# Patient Record
Sex: Female | Born: 1995 | Race: Black or African American | Hispanic: No | Marital: Single | Smoking: Never smoker
Health system: Southern US, Community
[De-identification: ages and names within clinical notes are randomized; demographics above are authoritative.]

## PROBLEM LIST (undated history)

## (undated) DIAGNOSIS — K219 Gastro-esophageal reflux disease without esophagitis: Secondary | ICD-10-CM

## (undated) DIAGNOSIS — K220 Achalasia of cardia: Secondary | ICD-10-CM

## (undated) DIAGNOSIS — Z973 Presence of spectacles and contact lenses: Secondary | ICD-10-CM

## (undated) DIAGNOSIS — R079 Chest pain, unspecified: Secondary | ICD-10-CM

## (undated) HISTORY — PX: NO PAST SURGERIES: SHX2092

---

## 2016-02-04 ENCOUNTER — Other Ambulatory Visit: Payer: Self-pay | Admitting: Gastroenterology

## 2016-02-04 DIAGNOSIS — R1310 Dysphagia, unspecified: Secondary | ICD-10-CM

## 2016-02-13 ENCOUNTER — Encounter (HOSPITAL_COMMUNITY): Payer: Self-pay | Admitting: *Deleted

## 2016-02-13 ENCOUNTER — Emergency Department (HOSPITAL_COMMUNITY)
Admission: EM | Admit: 2016-02-13 | Discharge: 2016-02-13 | Disposition: A | Discharge status: Home / Self Care | Payer: BLUE CROSS/BLUE SHIELD | Attending: Emergency Medicine | Admitting: Emergency Medicine

## 2016-02-13 DIAGNOSIS — R1310 Dysphagia, unspecified: Secondary | ICD-10-CM | POA: Diagnosis not present

## 2016-02-13 DIAGNOSIS — Z79899 Other long term (current) drug therapy: Secondary | ICD-10-CM | POA: Insufficient documentation

## 2016-02-13 DIAGNOSIS — R112 Nausea with vomiting, unspecified: Secondary | ICD-10-CM | POA: Diagnosis not present

## 2016-02-13 LAB — BASIC METABOLIC PANEL
Anion gap: 8 (ref 5–15)
BUN: 8 mg/dL (ref 6–20)
CALCIUM: 9.8 mg/dL (ref 8.9–10.3)
CO2: 29 mmol/L (ref 22–32)
CREATININE: 0.84 mg/dL (ref 0.44–1.00)
Chloride: 105 mmol/L (ref 101–111)
GFR calc non Af Amer: >60 mL/min (ref >60)
Glucose, Bld: 82 mg/dL (ref 65–99)
Potassium: 3.6 mmol/L (ref 3.5–5.1)
SODIUM: 142 mmol/L (ref 135–145)

## 2016-02-13 LAB — CBC WITH DIFFERENTIAL/PLATELET
BASOS PCT: 1 %
Basophils Absolute: 0 10*3/uL (ref 0.0–0.1)
EOS ABS: 0.1 10*3/uL (ref 0.0–0.7)
EOS PCT: 3 %
HCT: 42.8 % (ref 36.0–46.0)
Hemoglobin: 14 g/dL (ref 12.0–15.0)
Lymphocytes Relative: 46 %
Lymphs Abs: 2.6 10*3/uL (ref 0.7–4.0)
MCH: 26.6 pg (ref 26.0–34.0)
MCHC: 32.7 g/dL (ref 30.0–36.0)
MCV: 81.2 fL (ref 78.0–100.0)
MONO ABS: 0.3 10*3/uL (ref 0.1–1.0)
MONOS PCT: 6 %
NEUTROS ABS: 2.4 10*3/uL (ref 1.7–7.7)
NEUTROS PCT: 44 %
Platelets: 262 10*3/uL (ref 150–400)
RBC: 5.27 MIL/uL — ABNORMAL HIGH (ref 3.87–5.11)
RDW: 13.6 % (ref 11.5–15.5)
WBC: 5.5 10*3/uL (ref 4.0–10.5)

## 2016-02-13 LAB — I-STAT BETA HCG BLOOD, ED (MC, WL, AP ONLY)

## 2016-02-13 MED ORDER — SODIUM CHLORIDE 0.9 % IV BOLUS (SEPSIS)
1000.0000 mL | Freq: Once | INTRAVENOUS | Status: AC
  Administered 2016-02-13: 1000 mL via INTRAVENOUS

## 2016-02-13 MED ORDER — ONDANSETRON HCL 4 MG PO TABS: 4.0000 mg | ORAL_TABLET | Freq: Four times a day (QID) | ORAL | 0 refills | Status: DC

## 2016-02-13 MED ORDER — ONDANSETRON HCL 4 MG/2ML IJ SOLN: 4.0000 mg | Freq: Once | INTRAMUSCULAR | Status: DC

## 2016-02-13 NOTE — Progress Notes (Signed)
Patient listed as not having insurance or a pcp.  EDCM spoke to patient at bedside.  Patient reports she has [employer] insurance.  She reports she doesn't really have a pcp.  She saw a doctor today who referred her to the ED and reports they are trying get patient established with Dr. Susa [facility]Via of [facility].  No further EDCM needs at this time.

## 2016-02-13 NOTE — ED Triage Notes (Signed)
Pt reports the dysphagia has been doing on for 3 years, states that for the past few months it has  Gotten worse where she cannot have anything to drink without vomiting.  Went to see Dr. Loreta AveMann and had an ENDO done a few weeks ago and found to have Achalasia and is scheduled to have a barium swallow tomorrow.  Today, she went to the doctor's office d/t not being able keep anything down for the past 2 days, was sent here for IVF and admission to have the barium swallow.

## 2016-02-13 NOTE — ED Provider Notes (Signed)
[facility]-EMERGENCY DEPT Provider Note   CSN: [id number] Arrival date & time: 02/13/16  1512     History   Chief Complaint Chief Complaint  Patient presents with  . Dysphagia    HPI [name] is a 20 y.o. female.  HPI Pt has been having difficulty with swallowing for the last several years.  In the last few months it has gotten worse.  She would have difficulty swallowing  every day.  Then in the past two weeks since has been vomiting daily, sometimes after dinner, sometimes at night.  She was having trouble keeping down liquids and solids.  She went to GI, Dr Loreta AveMann.  She had an endoscopy.  She was told she might have achalasia.  A barium swallow was ordered.  Pt got sick a few days ago and was told she had a low grade fever.  She has not been able to eat or drink the last day. She went to the [facility] today and was referred to the ED to rule out obstruction. No abdominal pain.  No chest pain.  Temp at the doctors office was 98.9  History reviewed. No pertinent past medical history.  There are no active problems to display for this patient.   History reviewed. No pertinent surgical history.  OB History    No data available       Home Medications    Prior to Admission medications   Medication Sig Start Date End Date Taking? Authorizing Provider  omeprazole (PRILOSEC) 40 MG capsule Take 40 mg by mouth daily.   Yes Historical Provider, MD  ondansetron (ZOFRAN) 4 MG tablet Take 1 tablet (4 mg total) by mouth every 6 (six) hours. 02/13/16   Linwood DibblesJon Lena Fieldhouse, MD    Family History No family history on file.  Social History Social History  Substance Use Topics  . Smoking status: Never Smoker  . Smokeless tobacco: Never Used  . Alcohol use No     Allergies   Review of patient's allergies indicates no known allergies.   Review of Systems Review of Systems  Respiratory: Negative for wheezing.   Cardiovascular: Negative for chest pain.  Gastrointestinal: Positive  for nausea and vomiting. Negative for abdominal pain.       Mucus in the vomit   All other systems reviewed and are negative.    Physical Exam Updated Vital Signs BP 109/74 (BP Location: Left Arm)   Pulse 83   Temp 98.4 F (36.9 C) (Oral)   Resp 17   LMP 01/30/2016   SpO2 100%   Physical Exam  Constitutional: She appears well-developed and well-nourished. No distress.  HENT:  Head: Normocephalic and atraumatic.  Right Ear: External ear normal.  Left Ear: External ear normal.  Mouth/Throat: Oropharynx is clear and moist. No oropharyngeal exudate.  Mucous membranes are moist, handling secretions without difficulty  Eyes: Conjunctivae are normal. Right eye exhibits no discharge. Left eye exhibits no discharge. No scleral icterus.  Neck: Neck supple. No tracheal deviation present.  Cardiovascular: Normal rate, regular rhythm and intact distal pulses.   Pulmonary/Chest: Effort normal and breath sounds normal. No stridor. No respiratory distress. She has no wheezes. She has no rales.  Abdominal: Soft. Bowel sounds are normal. She exhibits no distension. There is no tenderness. There is no rebound and no guarding.  Musculoskeletal: She exhibits no edema or tenderness.  Neurological: She is alert. She has normal strength. No cranial nerve deficit (no facial droop, extraocular movements intact, no slurred speech) or sensory  deficit. She exhibits normal muscle tone. She displays no seizure activity. Coordination normal.  Skin: Skin is warm and dry. No rash noted.  Psychiatric: She has a normal mood and affect.  Nursing note and vitals reviewed.    ED Treatments / Results  Labs (all labs ordered are listed, but only abnormal results are displayed) Labs Reviewed  CBC WITH DIFFERENTIAL/PLATELET - Abnormal; Notable for the following:       Result Value   RBC 5.27 (*)    All other components within normal limits  BASIC METABOLIC PANEL  I-STAT BETA HCG BLOOD, ED ([facility], [facility], [facility] ONLY)     Radiology No results found.  Procedures Procedures (including critical care time)  Medications Ordered in ED Medications  ondansetron (ZOFRAN) injection 4 mg (0 mg Intravenous Hold 02/13/16 1701)  sodium chloride 0.9 % bolus 1,000 mL (1,000 mLs Intravenous New Bag/Given 02/13/16 1700)     Initial Impression / Assessment and Plan / ED Course  I have reviewed the triage vital signs and the nursing notes.  Pertinent labs & imaging results that were available during my care of the patient were reviewed by me and considered in my medical decision making (see chart for details).  Clinical Course  Comment By Time  Spoke with radiology tech.  Recommended calling radiology MD but they leave at 5pm.  Attempted to call radiology office but no answer.  Will not be able to get a barium swallow in the ED. Linwood Dibbles, MD 09/20 1657  Reviewed notes from the doctors office.  Instructed to come to the ED to rule out obstruction.  I doubt obstruction if the patient just had an endoscopy.  She is also handling her secretions in the ED.  Will start IV fluids, check labs Linwood Dibbles, MD 09/20 1657  Labs normal.  No signs of severe dehydration.  I will consult with GI on call for Dr Izzy Dun, MD 09/20 1753    I discussed the case with Dr Loreta Ave.  Esophageal obstruction is unlikely.  She is scheduled for an esophageal manometry procedure.  Pt can follow up with her tomorrow.  Pt had no vomiting in the ED.  Labs are reassuring.   Final Clinical Impressions(s) / ED Diagnoses   Final diagnoses:  Dysphagia    New Prescriptions New Prescriptions   ONDANSETRON (ZOFRAN) 4 MG TABLET    Take 1 tablet (4 mg total) by mouth every 6 (six) hours.     Linwood Dibbles, MD 02/13/16 [phone]

## 2016-02-14 ENCOUNTER — Ambulatory Visit
Admission: RE | Admit: 2016-02-14 | Discharge: 2016-02-14 | Disposition: A | Discharge status: Home / Self Care | Payer: BLUE CROSS/BLUE SHIELD | Source: Ambulatory Visit | Attending: Gastroenterology | Admitting: Gastroenterology

## 2016-02-14 ENCOUNTER — Other Ambulatory Visit: Payer: Self-pay

## 2016-02-14 DIAGNOSIS — R1310 Dysphagia, unspecified: Secondary | ICD-10-CM

## 2016-02-18 ENCOUNTER — Other Ambulatory Visit: Payer: Self-pay

## 2016-02-18 ENCOUNTER — Ambulatory Visit (HOSPITAL_COMMUNITY)
Admission: RE | Admit: 2016-02-18 | Discharge: 2016-02-18 | Disposition: A | Discharge status: Home / Self Care | Payer: BLUE CROSS/BLUE SHIELD | Source: Ambulatory Visit | Attending: Gastroenterology | Admitting: Gastroenterology

## 2016-02-18 ENCOUNTER — Encounter (HOSPITAL_COMMUNITY)
Admission: RE | Disposition: A | Discharge status: Home / Self Care | Payer: Self-pay | Source: Ambulatory Visit | Attending: Gastroenterology

## 2016-02-18 DIAGNOSIS — K220 Achalasia of cardia: Secondary | ICD-10-CM | POA: Diagnosis not present

## 2016-02-18 DIAGNOSIS — R1310 Dysphagia, unspecified: Secondary | ICD-10-CM | POA: Diagnosis present

## 2016-02-18 HISTORY — PX: ESOPHAGEAL MANOMETRY: SHX5429

## 2016-02-18 SURGERY — MANOMETRY, ESOPHAGUS

## 2016-02-18 MED ORDER — LIDOCAINE VISCOUS 2 % MT SOLN
OROMUCOSAL | Status: AC
  Filled 2016-02-18: qty 15

## 2016-02-18 SURGICAL SUPPLY — 2 items
FACESHIELD LNG OPTICON STERILE (SAFETY) IMPLANT
GLOVE BIO SURGEON STRL SZ8 (GLOVE) ×6 IMPLANT

## 2016-02-19 ENCOUNTER — Other Ambulatory Visit: Payer: Self-pay

## 2016-02-20 ENCOUNTER — Encounter (HOSPITAL_COMMUNITY): Payer: Self-pay | Admitting: Gastroenterology

## 2016-02-22 ENCOUNTER — Other Ambulatory Visit: Payer: Self-pay

## 2016-02-22 ENCOUNTER — Ambulatory Visit: Payer: Self-pay | Admitting: General Surgery

## 2016-02-22 NOTE — H&P (Signed)
Renee KirksDiane Robinson Cook 02/22/2016 2:27 PM Location: [facility] Patient #: [id number] DOB: 09/11/1995 Single / Language: [place] / Race: Black or African American Female  History of Present Illness Adolph Pollack([name] J. Renee Ishii MD; 02/22/2016 3:37 PM) The patient is a 20 year old female.   Note:She was referred by Dr. Loreta AveMann for consultation regarding newly diagnosed achalasia. For the past 3 years she been having intermittent difficulty was all allowing. It was felt she had gastroesophageal reflux. Recently she's had more and more problems with solids and sometimes liquids. She's been on a proton pump inhibitor but her symptoms have continued. She regurgitates and vomits food at times. She is getting a little better now since her diet was restricted. She underwent an upper endoscopy by Dr. Loreta AveMann which demonstrated a dilated esophagus containing liquid and food particles. A barium swallow demonstrated a dilated esophagus with a distal narrowing consistent with a bird's beak deformity. Manometry was attempted and only could be partially done. There was no noted peristalsis but the study was incomplete. She is here with her mother and brother. They have done some reading on the subject. She is a Consulting civil engineerstudent at [employer]. [place] is in [employer]  Other Problems ([place]; 02/22/2016 2:27 PM) Gastroesophageal Reflux Disease  Past Surgical History ([place]; 02/22/2016 2:27 PM) No pertinent past surgical history  Diagnostic Studies History ([place]; 02/22/2016 2:27 PM) Colonoscopy never  Allergies ([place]; 02/22/2016 2:29 PM) No Known Drug Allergies 02/22/2016  Medication History ([place]; 02/22/2016 2:29 PM) No Current Medications Medications Reconciled  Social History ([place]; 02/22/2016 2:27 PM) No alcohol use No caffeine use No drug use Tobacco use Never smoker.  Family History ([place]; 02/22/2016 2:27  PM) Hypertension Father, Mother. Migraine Headache Sister.  Pregnancy / Birth History (April Joana ReamerStaton, [place]; 02/22/2016 2:27 PM) Age at menarche 12 years. Gravida 0 Para 0 Regular periods     Review of Systems (April Staton CMA; 02/22/2016 2:27 PM) General Present- Fatigue and Weight Loss. Not Present- Appetite Loss, Chills, Fever, Night Sweats and Weight Gain. Skin Not Present- Change in Wart/Mole, Dryness, Hives, Jaundice, New Lesions, Non-Healing Wounds, Rash and Ulcer. HEENT Present- Seasonal Allergies and Wears glasses/contact lenses. Not Present- Earache, Hearing Loss, Hoarseness, Nose Bleed, Oral Ulcers, Ringing in the Ears, Sinus Pain, Sore Throat, Visual Disturbances and Yellow Eyes. Respiratory Not Present- Bloody sputum, Chronic Cough, Difficulty Breathing, Snoring and Wheezing. Breast Not Present- Breast Mass, Breast Pain, Nipple Discharge and Skin Changes. Cardiovascular Present- Chest Pain. Not Present- Difficulty Breathing Lying Down, Leg Cramps, Palpitations, Rapid Heart Rate, Shortness of Breath and Swelling of Extremities. Gastrointestinal Present- Constipation, Difficulty Swallowing and Vomiting. Not Present- Abdominal Pain, Bloating, Bloody Stool, Change in Bowel Habits, Chronic diarrhea, Excessive gas, Gets full quickly at meals, Hemorrhoids, Indigestion, Nausea and Rectal Pain. Female Genitourinary Not Present- Frequency, Nocturia, Painful Urination, Pelvic Pain and Urgency. Musculoskeletal Not Present- Back Pain, Joint Pain, Joint Stiffness, Muscle Pain, Muscle Weakness and Swelling of Extremities. Neurological Not Present- Decreased Memory, Fainting, Headaches, Numbness, Seizures, Tingling, Tremor, Trouble walking and Weakness. Psychiatric Not Present- Anxiety, Bipolar, Change in Sleep Pattern, Depression, Fearful and Frequent crying. Endocrine Not Present- Cold Intolerance, Excessive Hunger, Hair Changes, Heat Intolerance, Hot flashes and New Diabetes. Hematology  Not Present- Blood Thinners, Easy Bruising, Excessive bleeding, Gland problems, HIV and Persistent Infections.  Vitals (April Staton CMA; 02/22/2016 2:30 PM) 02/22/2016 2:30 PM Weight: 107.13 lb Height: 63in Height was reported by patient. Body Surface Area:  1.48 m Body Mass Index: 18.98 kg/m  Temp.: 98.87F(Oral)  Pulse: 70 (Regular)  P.OX: 96% (Room air) BP: 110/60 (Sitting, Left Arm, Standard)      Assessment & Plan Adolph Pollack MD; 02/22/2016 3:41 PM)  ACHALASIA (K22.0) Impression: This is been recently diagnosed as progress to the point where she will need an intervention. We discussed the different modalities of treatment including Botox injection, pneumatic dilatation, and Heller myotomy. I do not feel there is enough long term data on POEM to recommended it as an option. We'll long discussion about eating now and after the surgery. We discussed the etiology of achalasia and the long-term results of the Heller myotomy versus the other treatment modalities.  Plan: They're interested in pursuing the [facility] myotomy. I discussed laparoscopic Heller myotomy and Dor fundoplication (to try to decrease the risk of reflux) with them at length. The procedure and risks of the operation were explained. Risks include but are not limited to bleeding, infection, wound healing problems, anesthesia, esophageal perforation, injury to the stomach/colon/spleen/liver, need for reoperation failure of operation to improve the symptoms, reflux disease, dysphagia to some foods. We talked about importance of eating certain types of foods and certain long term behavioral changes with respect to diet.  Avel Peace, M.D.

## 2016-04-21 ENCOUNTER — Encounter (HOSPITAL_COMMUNITY): Payer: Self-pay | Admitting: *Deleted

## 2016-04-21 ENCOUNTER — Encounter (HOSPITAL_COMMUNITY)
Admission: RE | Admit: 2016-04-21 | Discharge: 2016-04-21 | Disposition: A | Discharge status: Home / Self Care | Payer: BLUE CROSS/BLUE SHIELD | Source: Ambulatory Visit | Attending: General Surgery | Admitting: General Surgery

## 2016-04-21 DIAGNOSIS — K220 Achalasia of cardia: Secondary | ICD-10-CM

## 2016-04-21 DIAGNOSIS — Z0183 Encounter for blood typing: Secondary | ICD-10-CM | POA: Insufficient documentation

## 2016-04-21 DIAGNOSIS — Z01812 Encounter for preprocedural laboratory examination: Secondary | ICD-10-CM

## 2016-04-21 HISTORY — DX: Presence of spectacles and contact lenses: Z97.3

## 2016-04-21 HISTORY — DX: Chest pain, unspecified: R07.9

## 2016-04-21 HISTORY — DX: Achalasia of cardia: K22.0

## 2016-04-21 HISTORY — DX: Gastro-esophageal reflux disease without esophagitis: K21.9

## 2016-04-21 LAB — BASIC METABOLIC PANEL
ANION GAP: 6 (ref 5–15)
BUN: 10 mg/dL (ref 6–20)
CHLORIDE: 109 mmol/L (ref 101–111)
CO2: 27 mmol/L (ref 22–32)
Calcium: 10 mg/dL (ref 8.9–10.3)
Creatinine, Ser: 0.99 mg/dL (ref 0.44–1.00)
GFR calc non Af Amer: >60 mL/min (ref >60)
Glucose, Bld: 64 mg/dL — ABNORMAL LOW (ref 65–99)
POTASSIUM: 5.1 mmol/L (ref 3.5–5.1)
Sodium: 142 mmol/L (ref 135–145)

## 2016-04-21 LAB — CBC WITH DIFFERENTIAL/PLATELET
Basophils Absolute: 0 10*3/uL (ref 0.0–0.1)
Basophils Relative: 0 %
Eosinophils Absolute: 0 10*3/uL (ref 0.0–0.7)
Eosinophils Relative: 1 %
HCT: 41.5 % (ref 36.0–46.0)
HEMOGLOBIN: 13.1 g/dL (ref 12.0–15.0)
LYMPHS ABS: 2 10*3/uL (ref 0.7–4.0)
Lymphocytes Relative: 36 %
MCH: 25.6 pg — AB (ref 26.0–34.0)
MCHC: 31.6 g/dL (ref 30.0–36.0)
MCV: 81.2 fL (ref 78.0–100.0)
MONOS PCT: 9 %
Monocytes Absolute: 0.5 10*3/uL (ref 0.1–1.0)
NEUTROS PCT: 54 %
Neutro Abs: 2.9 10*3/uL (ref 1.7–7.7)
Platelets: 261 10*3/uL (ref 150–400)
RBC: 5.11 MIL/uL (ref 3.87–5.11)
RDW: 13.8 % (ref 11.5–15.5)
WBC: 5.5 10*3/uL (ref 4.0–10.5)

## 2016-04-21 LAB — ABO/RH: ABO/RH(D): B POS

## 2016-04-21 NOTE — Patient Instructions (Signed)
[name]  04/21/2016   Your procedure is scheduled on: Thursday April 24, 2016  [facility]  [place] to 3rd floor to  Short Stay Center at 5:30 AM.  Call this number if you have problems the morning of surgery [phone]   Remember: ONLY 1 PERSON MAY GO WITH YOU TO SHORT STAY TO GET  READY MORNING OF YOUR SURGERY.  Do not eat food or drink liquids :After Midnight.     Take these medicines the morning of surgery:  NONE                                You may not have any metal on your body including hair pins and              piercings  Do not wear jewelry, make-up, lotions, powders or perfumes, deodorant             Do not wear nail polish.  Do not shave  48 hours prior to surgery.     Do not bring valuables to the hospital. [facility] IS NOT             RESPONSIBLE   FOR VALUABLES.  Contacts, dentures or bridgework may not be worn into surgery.  Leave suitcase in the car. After surgery it may be brought to your room. _____________________________________________________________________             [facility] - Pre[facility] Before surgery, you can play an important role.  Because skin is not sterile, your skin needs to be as free of germs as possible.  You can reduce the number of germs on your skin by washing with CHG (chlorahexidine gluconate) soap before surgery.  CHG is an antiseptic cleaner which kills germs and bonds with the skin to continue killing germs even after washing. Please DO NOT use if you have an allergy to CHG or antibacterial soaps.  If your skin becomes reddened/irritated stop using the CHG and inform your nurse when you arrive at Short Stay. Do not shave (including legs and underarms) for at least 48 hours prior to the first CHG shower.  You may shave your face/neck. Please follow these instructions carefully:  1.  Shower with CHG Soap the night before surgery and the   morning of Surgery.  2.  If you choose to wash your hair, wash your hair first as usual with your  normal  shampoo.  3.  After you shampoo, rinse your hair and body thoroughly to remove the  shampoo.                           4.  Use CHG as you would any other liquid soap.  You can apply chg directly  to the skin and wash                       Gently with a scrungie or clean washcloth.  5.  Apply the CHG Soap to your body ONLY FROM THE NECK DOWN.   Do not use on face/ open                           Wound or open sores.  Avoid contact with eyes, ears mouth and genitals (private parts).                       Wash face,  Genitals (private parts) with your normal soap.             6.  Wash thoroughly, paying special attention to the area where your surgery  will be performed.  7.  Thoroughly rinse your body with warm water from the neck down.  8.  DO NOT shower/wash with your normal soap after using and rinsing off  the CHG Soap.                9.  Pat yourself dry with a clean towel.            10.  Wear clean pajamas.            11.  Place clean sheets on your bed the night of your first shower and do not  sleep with pets. Day of Surgery : Do not apply any lotions/deodorants the morning of surgery.  Please wear clean clothes to the hospital/surgery center.  FAILURE TO FOLLOW THESE INSTRUCTIONS MAY RESULT IN THE CANCELLATION OF YOUR SURGERY PATIENT SIGNATURE_________________________________  NURSE SIGNATURE__________________________________  ________________________________________________________________________

## 2016-04-21 NOTE — Progress Notes (Signed)
BMP results in epic per PAT visit 04/21/2016 sent to Dr Abbey Chattersosenbower

## 2016-04-23 NOTE — Anesthesia Preprocedure Evaluation (Signed)
Anesthesia Evaluation  Patient identified by MRN, date of birth, ID band Patient awake    Reviewed: Allergy & Precautions, NPO status , Patient's Chart, lab work & pertinent test results  Airway Mallampati: I  TM Distance: >3 FB Neck ROM: Full    Dental no notable dental hx.    Pulmonary neg pulmonary ROS,    Pulmonary exam normal breath sounds clear to auscultation       Cardiovascular negative cardio ROS Normal cardiovascular exam Rhythm:Regular Rate:Normal     Neuro/Psych negative neurological ROS  negative psych ROS   GI/Hepatic negative GI ROS, Neg liver ROS,   Endo/Other  negative endocrine ROS  Renal/GU negative Renal ROS  negative genitourinary   Musculoskeletal negative musculoskeletal ROS (+)   Abdominal   Peds negative pediatric ROS (+)  Hematology negative hematology ROS (+)   Anesthesia Other Findings   Reproductive/Obstetrics negative OB ROS                             Anesthesia Physical Anesthesia Plan  ASA: II  Anesthesia Plan: General   Post-op Pain Management:    Induction: Intravenous, Rapid sequence and Cricoid pressure planned  Airway Management Planned: Oral ETT  Additional Equipment:   Intra-op Plan:   Post-operative Plan: Extubation in OR  Informed Consent: I have reviewed the patients History and Physical, chart, labs and discussed the procedure including the risks, benefits and alternatives for the proposed anesthesia with the patient or authorized representative who has indicated his/her understanding and acceptance.   Dental advisory given  Plan Discussed with: CRNA  Anesthesia Plan Comments:         Anesthesia Quick Evaluation

## 2016-04-24 ENCOUNTER — Ambulatory Visit (HOSPITAL_COMMUNITY): Payer: BLUE CROSS/BLUE SHIELD | Admitting: Anesthesiology

## 2016-04-24 ENCOUNTER — Encounter (HOSPITAL_COMMUNITY)
Admission: AD | Disposition: A | Discharge status: Home / Self Care | Payer: Self-pay | Source: Ambulatory Visit | Attending: General Surgery

## 2016-04-24 ENCOUNTER — Inpatient Hospital Stay (HOSPITAL_COMMUNITY)
Admission: AD | Admit: 2016-04-24 | Discharge: 2016-04-26 | DRG: 328 | Disposition: A | Discharge status: Home / Self Care | Payer: BLUE CROSS/BLUE SHIELD | Source: Ambulatory Visit | Attending: General Surgery | Admitting: General Surgery

## 2016-04-24 ENCOUNTER — Encounter (HOSPITAL_COMMUNITY): Payer: Self-pay | Admitting: *Deleted

## 2016-04-24 DIAGNOSIS — K228 Other specified diseases of esophagus: Secondary | ICD-10-CM | POA: Diagnosis present

## 2016-04-24 DIAGNOSIS — K910 Vomiting following gastrointestinal surgery: Secondary | ICD-10-CM | POA: Diagnosis not present

## 2016-04-24 DIAGNOSIS — K219 Gastro-esophageal reflux disease without esophagitis: Secondary | ICD-10-CM | POA: Diagnosis present

## 2016-04-24 DIAGNOSIS — K220 Achalasia of cardia: Secondary | ICD-10-CM | POA: Diagnosis present

## 2016-04-24 DIAGNOSIS — R52 Pain, unspecified: Secondary | ICD-10-CM

## 2016-04-24 DIAGNOSIS — Z79899 Other long term (current) drug therapy: Secondary | ICD-10-CM

## 2016-04-24 HISTORY — PX: LAPAROSCOPIC NISSEN FUNDOPLICATION: SHX1932

## 2016-04-24 HISTORY — PX: UPPER GI ENDOSCOPY: SHX6162

## 2016-04-24 LAB — CREATININE, SERUM
CREATININE: 0.85 mg/dL (ref 0.44–1.00)
GFR calc Af Amer: >60 mL/min (ref >60)
GFR calc non Af Amer: >60 mL/min (ref >60)

## 2016-04-24 LAB — CBC
HCT: 36.7 % (ref 36.0–46.0)
Hemoglobin: 11.8 g/dL — ABNORMAL LOW (ref 12.0–15.0)
MCH: 25.7 pg — AB (ref 26.0–34.0)
MCHC: 32.2 g/dL (ref 30.0–36.0)
MCV: 79.8 fL (ref 78.0–100.0)
PLATELETS: 199 10*3/uL (ref 150–400)
RBC: 4.6 MIL/uL (ref 3.87–5.11)
RDW: 13.8 % (ref 11.5–15.5)
WBC: 15.5 10*3/uL — ABNORMAL HIGH (ref 4.0–10.5)

## 2016-04-24 LAB — TYPE AND SCREEN
ABO/RH(D): B POS
ANTIBODY SCREEN: NEGATIVE

## 2016-04-24 LAB — HCG, SERUM, QUALITATIVE: PREG SERUM: NEGATIVE

## 2016-04-24 SURGERY — FUNDOPLICATION, NISSEN, LAPAROSCOPIC
Anesthesia: General | Site: Abdomen

## 2016-04-24 MED ORDER — MORPHINE SULFATE (PF) 2 MG/ML IV SOLN
2.0000 mg | INTRAVENOUS | Status: DC | PRN
  Administered 2016-04-24: 4 mg via INTRAVENOUS
  Administered 2016-04-24 – 2016-04-25 (×3): 2 mg via INTRAVENOUS
  Administered 2016-04-25: 4 mg via INTRAVENOUS
  Administered 2016-04-26 (×2): 2 mg via INTRAVENOUS
  Filled 2016-04-24: qty 2
  Filled 2016-04-24: qty 1
  Filled 2016-04-24: qty 2
  Filled 2016-04-24 (×4): qty 1

## 2016-04-24 MED ORDER — HYDROMORPHONE HCL 1 MG/ML IJ SOLN
INTRAMUSCULAR | Status: AC
  Filled 2016-04-24: qty 1

## 2016-04-24 MED ORDER — CEFAZOLIN SODIUM-DEXTROSE 2-4 GM/100ML-% IV SOLN
INTRAVENOUS | Status: AC
  Filled 2016-04-24: qty 100

## 2016-04-24 MED ORDER — SUGAMMADEX SODIUM 200 MG/2ML IV SOLN
INTRAVENOUS | Status: AC
  Filled 2016-04-24: qty 2

## 2016-04-24 MED ORDER — FENTANYL CITRATE (PF) 250 MCG/5ML IJ SOLN
INTRAMUSCULAR | Status: AC
  Filled 2016-04-24: qty 5

## 2016-04-24 MED ORDER — PANTOPRAZOLE SODIUM 40 MG IV SOLR
40.0000 mg | Freq: Every day | INTRAVENOUS | Status: DC
  Administered 2016-04-24 – 2016-04-25 (×2): 40 mg via INTRAVENOUS
  Filled 2016-04-24 (×3): qty 40

## 2016-04-24 MED ORDER — PHENYLEPHRINE 40 MCG/ML (10ML) SYRINGE FOR IV PUSH (FOR BLOOD PRESSURE SUPPORT)
PREFILLED_SYRINGE | INTRAVENOUS | Status: AC
  Filled 2016-04-24: qty 10

## 2016-04-24 MED ORDER — ROCURONIUM BROMIDE 50 MG/5ML IV SOSY
PREFILLED_SYRINGE | INTRAVENOUS | Status: AC
Start: 2016-04-24 — End: 2016-04-24
  Filled 2016-04-24: qty 5

## 2016-04-24 MED ORDER — HEPARIN SODIUM (PORCINE) 5000 UNIT/ML IJ SOLN
5000.0000 [IU] | Freq: Three times a day (TID) | INTRAMUSCULAR | Status: DC
  Administered 2016-04-25 – 2016-04-26 (×4): 5000 [IU] via SUBCUTANEOUS
  Filled 2016-04-24 (×4): qty 1

## 2016-04-24 MED ORDER — CEFAZOLIN SODIUM-DEXTROSE 2-4 GM/100ML-% IV SOLN
2.0000 g | INTRAVENOUS | Status: AC
  Administered 2016-04-24: 2 g via INTRAVENOUS

## 2016-04-24 MED ORDER — CHLORHEXIDINE GLUCONATE 0.12 % MT SOLN
15.0000 mL | Freq: Two times a day (BID) | OROMUCOSAL | Status: DC
  Administered 2016-04-25: 15 mL via OROMUCOSAL
  Filled 2016-04-24: qty 15

## 2016-04-24 MED ORDER — PROMETHAZINE HCL 25 MG/ML IJ SOLN: 12.5000 mg | INTRAMUSCULAR | Status: DC | PRN

## 2016-04-24 MED ORDER — BUPIVACAINE HCL (PF) 0.5 % IJ SOLN
INTRAMUSCULAR | Status: AC
  Filled 2016-04-24: qty 60

## 2016-04-24 MED ORDER — LACTATED RINGERS IV SOLN
INTRAVENOUS | Status: DC | PRN
  Administered 2016-04-24 (×2): via INTRAVENOUS

## 2016-04-24 MED ORDER — ORAL CARE MOUTH RINSE
15.0000 mL | Freq: Two times a day (BID) | OROMUCOSAL | Status: DC
  Administered 2016-04-25: 15 mL via OROMUCOSAL

## 2016-04-24 MED ORDER — LIDOCAINE 2% (20 MG/ML) 5 ML SYRINGE
INTRAMUSCULAR | Status: AC
  Filled 2016-04-24: qty 5

## 2016-04-24 MED ORDER — BUPIVACAINE HCL (PF) 0.5 % IJ SOLN
INTRAMUSCULAR | Status: DC | PRN
  Administered 2016-04-24: 15 mL

## 2016-04-24 MED ORDER — MIDAZOLAM HCL 5 MG/5ML IJ SOLN
INTRAMUSCULAR | Status: DC | PRN
  Administered 2016-04-24: 2 mg via INTRAVENOUS

## 2016-04-24 MED ORDER — LACTATED RINGERS IV SOLN: INTRAVENOUS | Status: DC

## 2016-04-24 MED ORDER — 0.9 % SODIUM CHLORIDE (POUR BTL) OPTIME
TOPICAL | Status: DC | PRN
  Administered 2016-04-24: 1000 mL

## 2016-04-24 MED ORDER — HYDROMORPHONE HCL 1 MG/ML IJ SOLN
0.2500 mg | INTRAMUSCULAR | Status: DC | PRN
  Administered 2016-04-24 (×2): 0.5 mg via INTRAVENOUS

## 2016-04-24 MED ORDER — ROCURONIUM BROMIDE 10 MG/ML (PF) SYRINGE
PREFILLED_SYRINGE | INTRAVENOUS | Status: DC | PRN
  Administered 2016-04-24: 20 mg via INTRAVENOUS
  Administered 2016-04-24: 30 mg via INTRAVENOUS
  Administered 2016-04-24: 10 mg via INTRAVENOUS

## 2016-04-24 MED ORDER — LACTATED RINGERS IR SOLN
Status: DC | PRN
  Administered 2016-04-24: 1000 mL

## 2016-04-24 MED ORDER — ONDANSETRON HCL 4 MG/2ML IJ SOLN: 4.0000 mg | INTRAMUSCULAR | Status: DC | PRN

## 2016-04-24 MED ORDER — KCL IN DEXTROSE-NACL 20-5-0.9 MEQ/L-%-% IV SOLN
INTRAVENOUS | Status: DC
  Administered 2016-04-24 – 2016-04-26 (×4): via INTRAVENOUS
  Filled 2016-04-24 (×4): qty 1000

## 2016-04-24 MED ORDER — FENTANYL CITRATE (PF) 100 MCG/2ML IJ SOLN
INTRAMUSCULAR | Status: DC | PRN
  Administered 2016-04-24 (×3): 50 ug via INTRAVENOUS

## 2016-04-24 MED ORDER — MIDAZOLAM HCL 2 MG/2ML IJ SOLN
INTRAMUSCULAR | Status: AC
  Filled 2016-04-24: qty 2

## 2016-04-24 MED ORDER — MEPERIDINE HCL 50 MG/ML IJ SOLN: 6.2500 mg | INTRAMUSCULAR | Status: DC | PRN

## 2016-04-24 MED ORDER — ONDANSETRON HCL 4 MG/2ML IJ SOLN
INTRAMUSCULAR | Status: AC
  Filled 2016-04-24: qty 2

## 2016-04-24 MED ORDER — PROMETHAZINE HCL 25 MG/ML IJ SOLN: 6.2500 mg | INTRAMUSCULAR | Status: DC | PRN

## 2016-04-24 MED ORDER — LIDOCAINE 2% (20 MG/ML) 5 ML SYRINGE
INTRAMUSCULAR | Status: DC | PRN
  Administered 2016-04-24: 80 mg via INTRAVENOUS

## 2016-04-24 MED ORDER — CHLORHEXIDINE GLUCONATE CLOTH 2 % EX PADS: 6.0000 | MEDICATED_PAD | Freq: Once | CUTANEOUS | Status: DC

## 2016-04-24 MED ORDER — PHENYLEPHRINE 40 MCG/ML (10ML) SYRINGE FOR IV PUSH (FOR BLOOD PRESSURE SUPPORT)
PREFILLED_SYRINGE | INTRAVENOUS | Status: DC | PRN
  Administered 2016-04-24: 80 ug via INTRAVENOUS
  Administered 2016-04-24: 40 ug via INTRAVENOUS

## 2016-04-24 MED ORDER — DEXAMETHASONE SODIUM PHOSPHATE 10 MG/ML IJ SOLN
INTRAMUSCULAR | Status: DC | PRN
  Administered 2016-04-24: 10 mg via INTRAVENOUS

## 2016-04-24 MED ORDER — ONDANSETRON HCL 4 MG/2ML IJ SOLN
4.0000 mg | INTRAMUSCULAR | Status: AC
  Administered 2016-04-24 – 2016-04-25 (×4): 4 mg via INTRAVENOUS
  Filled 2016-04-24 (×3): qty 2

## 2016-04-24 MED ORDER — PROPOFOL 10 MG/ML IV BOLUS
INTRAVENOUS | Status: DC | PRN
  Administered 2016-04-24: 120 mg via INTRAVENOUS

## 2016-04-24 MED ORDER — CEFAZOLIN IN D5W 1 GM/50ML IV SOLN
1.0000 g | Freq: Three times a day (TID) | INTRAVENOUS | Status: AC
  Administered 2016-04-24: 1 g via INTRAVENOUS
  Filled 2016-04-24: qty 50

## 2016-04-24 MED ORDER — ONDANSETRON HCL 4 MG/2ML IJ SOLN
INTRAMUSCULAR | Status: DC | PRN
  Administered 2016-04-24: 4 mg via INTRAVENOUS

## 2016-04-24 MED ORDER — PROPOFOL 10 MG/ML IV BOLUS
INTRAVENOUS | Status: AC
  Filled 2016-04-24: qty 20

## 2016-04-24 MED ORDER — DEXAMETHASONE SODIUM PHOSPHATE 10 MG/ML IJ SOLN
INTRAMUSCULAR | Status: AC
  Filled 2016-04-24: qty 1

## 2016-04-24 MED ORDER — ROCURONIUM BROMIDE 50 MG/5ML IV SOSY
PREFILLED_SYRINGE | INTRAVENOUS | Status: AC
  Filled 2016-04-24: qty 5

## 2016-04-24 MED ORDER — SUCCINYLCHOLINE CHLORIDE 200 MG/10ML IV SOSY
PREFILLED_SYRINGE | INTRAVENOUS | Status: AC
  Filled 2016-04-24: qty 10

## 2016-04-24 MED ORDER — SUGAMMADEX SODIUM 200 MG/2ML IV SOLN
INTRAVENOUS | Status: DC | PRN
  Administered 2016-04-24: 125 mg via INTRAVENOUS

## 2016-04-24 MED ORDER — ONDANSETRON 4 MG PO TBDP: 4.0000 mg | ORAL_TABLET | Freq: Four times a day (QID) | ORAL | Status: DC | PRN

## 2016-04-24 MED ORDER — SUCCINYLCHOLINE CHLORIDE 200 MG/10ML IV SOSY
PREFILLED_SYRINGE | INTRAVENOUS | Status: DC | PRN
  Administered 2016-04-24: 80 mg via INTRAVENOUS

## 2016-04-24 SURGICAL SUPPLY — 62 items
APPLIER CLIP 5 13 M/L LIGAMAX5 (MISCELLANEOUS) ×4
APPLIER CLIP ROT 10 11.4 M/L (STAPLE) ×4
BENZOIN TINCTURE PRP APPL 2/3 (GAUZE/BANDAGES/DRESSINGS) ×4 IMPLANT
CHLORAPREP W/TINT 26ML (MISCELLANEOUS) ×4 IMPLANT
CLIP APPLIE 5 13 M/L LIGAMAX5 (MISCELLANEOUS) ×2 IMPLANT
CLIP APPLIE ROT 10 11.4 M/L (STAPLE) ×2 IMPLANT
CLOSURE WOUND 1/2 X4 (GAUZE/BANDAGES/DRESSINGS) ×1
COVER SURGICAL LIGHT HANDLE (MISCELLANEOUS) ×4 IMPLANT
DECANTER SPIKE VIAL GLASS SM (MISCELLANEOUS) ×4 IMPLANT
DEVICE SUT QUICK LOAD TK 5 (STAPLE) ×21 IMPLANT
DEVICE SUT TI-KNOT TK 5X26 (MISCELLANEOUS) ×3 IMPLANT
DEVICE SUTURE ENDOST 10MM (ENDOMECHANICALS) ×4 IMPLANT
DEVICE TI KNOT TK5 (MISCELLANEOUS) ×1
DISSECTOR BLUNT TIP ENDO 5MM (MISCELLANEOUS) ×4 IMPLANT
DRAIN PENROSE 18X1/2 LTX STRL (DRAIN) ×4 IMPLANT
DRSG TEGADERM 2-3/8X2-3/4 SM (GAUZE/BANDAGES/DRESSINGS) ×4 IMPLANT
ELECT PENCIL ROCKER SW 15FT (MISCELLANEOUS) ×4 IMPLANT
ELECT REM PT RETURN 9FT ADLT (ELECTROSURGICAL) ×4
ELECTRODE REM PT RTRN 9FT ADLT (ELECTROSURGICAL) ×2 IMPLANT
FELT TEFLON 4 X1 (Mesh General) IMPLANT
FILTER SMOKE EVAC LAPAROSHD (FILTER) ×4 IMPLANT
GAUZE SPONGE 2X2 8PLY STRL LF (GAUZE/BANDAGES/DRESSINGS) ×2 IMPLANT
GLOVE BIO SURGEON STRL SZ 6.5 (GLOVE) ×3 IMPLANT
GLOVE BIO SURGEONS STRL SZ 6.5 (GLOVE) ×1
GLOVE BIOGEL PI IND STRL 6.5 (GLOVE) ×2 IMPLANT
GLOVE BIOGEL PI INDICATOR 6.5 (GLOVE) ×2
GLOVE ECLIPSE 8.0 STRL XLNG CF (GLOVE) ×4 IMPLANT
GLOVE INDICATOR 8.0 STRL GRN (GLOVE) ×8 IMPLANT
GLOVE SURG SS PI 6.5 STRL IVOR (GLOVE) ×4 IMPLANT
GOWN STRL REUS W/TWL LRG LVL3 (GOWN DISPOSABLE) ×8 IMPLANT
GOWN STRL REUS W/TWL XL LVL3 (GOWN DISPOSABLE) ×8 IMPLANT
IRRIG SUCT STRYKERFLOW 2 WTIP (MISCELLANEOUS) ×4
IRRIGATION SUCT STRKRFLW 2 WTP (MISCELLANEOUS) ×2 IMPLANT
KIT BASIN OR (CUSTOM PROCEDURE TRAY) ×4 IMPLANT
MARKER SKIN DUAL TIP RULER LAB (MISCELLANEOUS) ×4 IMPLANT
PAD POSITIONING PINK XL (MISCELLANEOUS) ×4 IMPLANT
POSITIONER SURGICAL ARM (MISCELLANEOUS) IMPLANT
QUICK LOAD TK 5 (STAPLE) ×7
RETRACTOR LAPSCP 12X46 CVD (ENDOMECHANICALS) IMPLANT
RTRCTR LAPSCP 12X46 CVD (ENDOMECHANICALS)
SCISSORS LAP 5X35 DISP (ENDOMECHANICALS) ×4 IMPLANT
SHEARS HARMONIC ACE PLUS 36CM (ENDOMECHANICALS) ×4 IMPLANT
SLEEVE XCEL OPT CAN 5 100 (ENDOMECHANICALS) ×4 IMPLANT
SOLUTION ANTI FOG 6CC (MISCELLANEOUS) ×4 IMPLANT
SPONGE GAUZE 2X2 STER 10/PKG (GAUZE/BANDAGES/DRESSINGS) ×2
STAPLER VISISTAT 35W (STAPLE) IMPLANT
STRIP CLOSURE SKIN 1/2X4 (GAUZE/BANDAGES/DRESSINGS) ×3 IMPLANT
SUT MNCRL AB 4-0 PS2 18 (SUTURE) ×4 IMPLANT
SUT SURGIDAC NAB ES-9 0 48 120 (SUTURE) ×28 IMPLANT
TAPE CLOTH 4X10 WHT NS (GAUZE/BANDAGES/DRESSINGS) IMPLANT
TIP INNERVISION DETACH 40FR (MISCELLANEOUS) IMPLANT
TIP INNERVISION DETACH 50FR (MISCELLANEOUS) IMPLANT
TIP INNERVISION DETACH 56FR (MISCELLANEOUS) IMPLANT
TIPS INNERVISION DETACH 40FR (MISCELLANEOUS)
TOWEL OR 17X26 10 PK STRL BLUE (TOWEL DISPOSABLE) ×8 IMPLANT
TOWEL OR NON WOVEN STRL DISP B (DISPOSABLE) ×4 IMPLANT
TRAY FOLEY CATH SILVER 14FR (SET/KITS/TRAYS/PACK) ×4 IMPLANT
TRAY LAPAROSCOPIC (CUSTOM PROCEDURE TRAY) ×4 IMPLANT
TROCAR XCEL BLUNT TIP 100MML (ENDOMECHANICALS) ×8 IMPLANT
TROCAR XCEL NON-BLD 11X100MML (ENDOMECHANICALS) ×4 IMPLANT
TROCAR XCEL UNIV SLVE 11M 100M (ENDOMECHANICALS) ×8 IMPLANT
TUBING INSUF HEATED (TUBING) ×4 IMPLANT

## 2016-04-24 NOTE — Op Note (Addendum)
Operative Note  [name] female 20 y.o. 04/24/2016  PREOPERATIVE DX:  Achalasia  POSTOPERATIVE DX:  Same  PROCEDURE:   Laparoscopic Heller myotomy and Dor fundoplication.                             Upper endoscopy (Dr. Ezzard StandingNewman)     Surgeon: Adolph PollackOSENBOWER,[name]   Assistants: Ovidio Kinavid Newman, M.D.  Anesthesia: General endotracheal anesthesia  Indications:   This is a 20 year old female with achalasia and progressive symptoms. She now presents for the above procedure.    Procedure Detail:  She was brought to the operating room and placed supine on the operating table and a general anesthetic was given. A Foley catheter was inserted. An oral gastric tube was inserted. The abdominal wall was widely sterilely prepped and draped. A timeout was performed.  Local anesthetic was infiltrated in the infraumbilical region. A small infraumbilical incision was made through the skin, subcutaneous tissue, fascia, and peritoneum entering the peritoneal cavity under direct vision. A pursestring suture of 0 Vicryl was placed around the edges of the fascia. A Hassan trocar was inserted into the peritoneal cavity and a pneumoperitoneum created by insufflation of CO2 gas. Inspection of the area under the trocar demonstrated no evidence of bleeding or organ injury.  She was placed in reverse Trendelenburg position. A 5 trocar was placed in the right upper quadrant. Two 5 mm trocars were placed in the left upper quadrant.  A 5 mm incision was made in the subxiphoid area and the self-retaining liver retractor was placed into the peritoneal cavity. This retracted the left lobe of liver anteriorly exposing the hiatus. A 5 mm trocar was placed in the left mid abdomen.  Using the harmonic scalpel, the gastrohepatic ligament was divided up to the level of the right crus. The phrenoesophageal ligament was divided exposing the anterior esophagus. I then divided short gastric vessels on the fundus up to the  level of the left crus. Using blunt dissection, a retroesophageal window was created. A Penrose drain was then placed through this window and inferior retraction on the gastroesophageal junction was performed.  Using a combination of blunt dissection, electrocautery, and the Harmonic scalpel longitudinal fibers of the distal esophagus were divided followed by circular fibers exposing the mucosa and heading superiorly approximately 5-6 cm. A 180 myotomy was performed. The myotomy was then extended inferiorly across the gastroesophageal junction and then onto the stomach for 2 cm.  At this time, upper endoscopy was performed by Dr. Ezzard StandingNewman. The area of the myotomy was put under irrigation fluid and there was no evidence of mucosal leak.  The gastroesophageal junction was widely patent.  Next, a Dor fundoplication was performed. Beginning on the left side, 2 nonabsorbable size 0 sutures were placed through the fundus of the stomach, the left crus, and the myotomy muscle fibers. A third suture was placed distal to these incorporating the fundus and the myotomy fibers.  The stomach was then placed anteriorly over the myotomy. 2 nonabsorbable size 0 sutures were placed to the fundus of the stomach, the myotomy fibers, and the right crus.  A third suture was placed distal to these incorporating the fundus and the myotomy fibers. A suture was then placed between the anterior stomach and the apex of the hiatus. There was no tension on the fundoplication.  The liver retractor was removed. A four quadrant and central inspection demonstrated no evidence of bleeding or organ injury. The  subumbilical trocar was removed and the fascial defects closed under laparoscopic vision by tying down the pursestring suture. The remaining trocars were removed and the pneumoperitoneum released.  All skin incisions were closed with 4-0 Monocryl subcuticular stitches. Steri-Strips and sterile dressings were applied.  She tolerated the  procedure well without any apparent complications and was taken to the recovery room in satisfactory condition.   Estimated Blood Loss:  100 ml         Specimens: none        Complications:  * No complications entered in OR log *         Disposition: PACU - hemodynamically stable.         Condition: stable  Note:  She had an episode of vomiting after extubation.

## 2016-04-24 NOTE — Progress Notes (Signed)
Pt's BP was 88/45. Paged Dr. Abbey Chattersosenbower and received no new orders. Will continue to monitor pt.

## 2016-04-24 NOTE — Transfer of Care (Signed)
Immediate Anesthesia Transfer of Care Note  Patient: [name]  Procedure(s) Performed: Procedure(s): LAPAROSCOPIC HELLER MYOTOMY AND DOR FUNDOPLICATION (N/A) UPPER GI ENDOSCOPY (N/A)  Patient Location: PACU  Anesthesia Type:General  Level of Consciousness: awake, alert , oriented and patient cooperative  Airway & Oxygen Therapy: Patient Spontanous Breathing and Patient connected to face mask oxygen  Post-op Assessment: Report given to RN, Post -op Vital signs reviewed and stable and Patient moving all extremities  Post vital signs: Reviewed and stable  Last Vitals:  Vitals:   04/24/16 0548  BP: 100/69  Pulse: 81  Resp: 16  Temp: 37.2 C    Last Pain:  Vitals:   04/24/16 0548  TempSrc: Oral      Patients Stated Pain Goal: 4 (04/24/16 [id number])  Complications: No apparent anesthesia complications

## 2016-04-24 NOTE — Anesthesia Procedure Notes (Signed)
Procedure Name: Intubation Date/Time: 04/24/2016 7:38 AM Performed by: Enriqueta ShutterWILLIFORD, Maleigh Bagot D Pre-anesthesia Checklist: Patient identified, Emergency Drugs available, Suction available and Patient being monitored Patient Re-evaluated:Patient Re-evaluated prior to inductionOxygen Delivery Method: Circle system utilized Preoxygenation: Pre-oxygenation with 100% oxygen Intubation Type: IV induction Ventilation: Mask ventilation without difficulty Laryngoscope Size: Miller and 2 Grade View: Grade I Tube type: Oral Tube size: 7.0 mm Number of attempts: 1 Airway Equipment and Method: Stylet and Oral airway Placement Confirmation: ETT inserted through vocal cords under direct vision,  positive ETCO2 and breath sounds checked- equal and bilateral Secured at: 22 cm Tube secured with: Tape Dental Injury: Teeth and Oropharynx as per pre-operative assessment

## 2016-04-24 NOTE — Progress Notes (Signed)
Dr Abbey Chattersosenbower to see patient.  Sats 100 percent and patient resting on right side.  Dr Abbey Chattersosenbower made aware of patient vomiting post extubation in the OR post surgery.

## 2016-04-24 NOTE — Progress Notes (Signed)
Dr. Renold DonGermeroth in- made aware by Erie NoeLacy Armistead, CRNA  That patient vomited in O.R. After extubation

## 2016-04-24 NOTE — Op Note (Signed)
Name:  [name] MRN: [id number] Date of Surgery: 04/24/2016  Preop Diagnosis:  Achalasia  Postop Diagnosis:  Achalasia  Procedure:  Upper endoscopy  (Intraoperative)  Surgeon:  Ovidio Kinavid Maddelynn Moosman, M.D.  Anesthesia:  GET  Indications for procedure: [name] is a 20 y.o. female whose primary care physician is No PCP Per Patient and has achalasia as documented by Dr. Arty BaumgartnerJ. Mann.  She is here for a Heller myotomy today by Dr. Abbey Chattersosenbower.  I am doing an intraoperative upper endoscopy to evaluate the esophagus, stomach, and myotomy.  Operative Note: The patient is under general anesthesia.  Dr. Abbey Chattersosenbower is laparoscoping the patient while I do an upper endoscopy to evaluate the myotomy.  With the patient intubated, I passed the Pentax endoscope into the esophagus without difficulty.  The esophagus was moderately dilated with a large amount of retained food.  I could not see the esophageal mucosa well because of the retained food.  The esophago-gastric junction was at 40 cm.   Dr. Abbey Chattersosenbower had taken the myotomy up the esophagus from the EG junction about 5-6 cm and onto the stomach below the EG junction about 2 cm.  The distal esophagus and esophagogastric opening was widely patent.   In fact, I push a lot of the food in the esophagus into the stomach on doing the endoscopy.  The mucosa underneath the myotomy looked good.  While I insufflated the distal esophagus and  stomach with air, Dr. Abbey Chattersosenbower  flooded the upper abdomen with saline to put the myotomy under saline.  There was no bubbling or evidence of a leak.    The scope was then withdrawn.  The patient tolerated the endoscopy without difficulty.  Ovidio Kinavid Sovereign Ramiro, MD, [facility] Pager: [phone] Office phone:  [phone]

## 2016-04-24 NOTE — Interval H&P Note (Signed)
History and Physical Interval Note:  04/24/2016 7:26 AM  [name]  has presented today for surgery, with the diagnosis of achalasia  The various methods of treatment have been discussed with the patient and family. After consideration of risks, benefits and other options for treatment, the patient has consented to  Procedure(s): LAPAROSCOPIC HELLER MYOTOMY AND DOR FUNDOPLICATION (N/A) as a surgical intervention .  The patient's history has been reviewed, patient examined, no change in status, stable for surgery.  I have reviewed the patient's chart and labs.  Questions were answered to the patient's satisfaction.     Shakita Keir JShela Commons

## 2016-04-24 NOTE — Anesthesia Postprocedure Evaluation (Signed)
Anesthesia Post Note  Patient: [name]  Procedure(s) Performed: Procedure(s) (LRB): LAPAROSCOPIC HELLER MYOTOMY AND DOR FUNDOPLICATION (N/A) UPPER GI ENDOSCOPY (N/A)  Patient location during evaluation: PACU Anesthesia Type: General Level of consciousness: sedated and patient cooperative Pain management: pain level controlled Vital Signs Assessment: post-procedure vital signs reviewed and stable Respiratory status: spontaneous breathing Cardiovascular status: stable Anesthetic complications: no    Last Vitals:  Vitals:   04/24/16 1055 04/24/16 1100  BP:  102/66  Pulse: 78 81  Resp: (!) 22 20  Temp:      Last Pain:  Vitals:   04/24/16 1100  TempSrc:   PainSc: 10-Worst pain ever                 Lewie LoronJohn Alyssandra Hulsebus

## 2016-04-24 NOTE — Addendum Note (Signed)
Addendum  created 04/24/16 1223 by Atilano MedianPeggy D Everlean Bucher, CRNA   Anesthesia Intra Flowsheets edited

## 2016-04-24 NOTE — H&P (Signed)
Renee BeltonDianne Cook is an 20 y.o. female.   Chief Complaint:  Here for elective surgery HPI:  She was referred by Dr. Loreta AveMann for consultation regarding newly diagnosed achalasia. For the past 3 years she been having intermittent difficulty was all allowing. It was felt she had gastroesophageal reflux. Recently she's had more and more problems with solids and sometimes liquids. She's been on a proton pump inhibitor but her symptoms have continued. She regurgitates and vomits food at times. She is getting a little better now since her diet was restricted. She underwent an upper endoscopy by Dr. Loreta AveMann which demonstrated a dilated esophagus containing liquid and food particles. A barium swallow demonstrated a dilated esophagus with a distal narrowing consistent with a bird's beak deformity. Manometry was attempted and only could be partially done. There was no noted peristalsis.  She presents for elective Heller myotomy.  Past Medical History:  Diagnosis Date  . Achalasia   . Chest pain   . GERD (gastroesophageal reflux disease)   . Wears glasses     Past Surgical History:  Procedure Laterality Date  . ESOPHAGEAL MANOMETRY N/A 02/18/2016   Procedure: ESOPHAGEAL MANOMETRY (EM);  Surgeon: Charna ElizabethJyothi Mann, MD;  Location: [facility];  Service: Endoscopy;  Laterality: N/A;  . NO PAST SURGERIES      History reviewed. No pertinent family history. Social History:  reports that she has never smoked. She has never used smokeless tobacco. She reports that she does not drink alcohol or use drugs.  Allergies: Not on File  Medications Prior to Admission  Medication Sig Dispense Refill  . ondansetron (ZOFRAN) 4 MG tablet Take 1 tablet (4 mg total) by mouth every 6 (six) hours. (Patient not taking: Reported on 04/11/2016) 12 tablet 0    Results for orders placed or performed during the hospital encounter of 04/24/16 (from the past 48 hour(s))  hCG, serum, qualitative     Status: None   Collection  Time: 04/24/16  6:05 AM  Result Value Ref Range   Preg, Serum NEGATIVE NEGATIVE    Comment:        THE SENSITIVITY OF THIS METHODOLOGY IS >10 mIU/mL.    No results found.  Review of Systems  Constitutional: Negative for chills and fever.  HENT: Negative for congestion and sore throat.   Respiratory: Negative for cough.   Gastrointestinal: Negative for abdominal pain and diarrhea.  Genitourinary: Negative for dysuria.    Blood pressure 100/69, pulse 81, temperature 99 F (37.2 C), temperature source Oral, resp. rate 16, height 5\' 3"  (1.6 m), weight 45.8 kg (101 lb), last menstrual period 03/20/2016, SpO2 100 %. Physical Exam  Constitutional: She is oriented to person, place, and time. No distress.  Thin female.  HENT:  Head: Normocephalic and atraumatic.  Cardiovascular: Normal rate and regular rhythm.   Respiratory: Effort normal and breath sounds normal.  GI: Soft. There is no tenderness.  Neurological: She is alert and oriented to person, place, and time.  Skin: Skin is warm and dry.  Psychiatric: She has a normal mood and affect. Her behavior is normal.     Assessment/Plan Achalasia  Plan:  Laparoscopic Heller myotomy and Dor fundoplication.  Adolph PollackOSENBOWER,Darryon Bastin J, MD 04/24/2016, 7:18 AM

## 2016-04-24 NOTE — Progress Notes (Signed)
Dr. Renold DonGermeroth in to check patient

## 2016-04-25 ENCOUNTER — Encounter (HOSPITAL_COMMUNITY): Payer: Self-pay | Admitting: General Surgery

## 2016-04-25 ENCOUNTER — Inpatient Hospital Stay (HOSPITAL_COMMUNITY): Payer: BLUE CROSS/BLUE SHIELD

## 2016-04-25 ENCOUNTER — Ambulatory Visit (HOSPITAL_COMMUNITY): Payer: BLUE CROSS/BLUE SHIELD

## 2016-04-25 MED ORDER — ONDANSETRON HCL 4 MG PO TABS: 4.0000 mg | ORAL_TABLET | ORAL | 0 refills | Status: AC | PRN

## 2016-04-25 MED ORDER — OXYCODONE HCL 5 MG PO TABS
5.0000 mg | ORAL_TABLET | ORAL | Status: DC | PRN
  Administered 2016-04-25 – 2016-04-26 (×2): 5 mg via ORAL
  Filled 2016-04-25 (×2): qty 1

## 2016-04-25 MED ORDER — DEXTROSE 5 % IV SOLN
500.0000 mg | Freq: Three times a day (TID) | INTRAVENOUS | Status: DC
  Administered 2016-04-25 – 2016-04-26 (×4): 500 mg via INTRAVENOUS
  Filled 2016-04-25 (×4): qty 550
  Filled 2016-04-25 (×3): qty 5

## 2016-04-25 MED ORDER — IOPAMIDOL (ISOVUE-300) INJECTION 61%
INTRAVENOUS | Status: AC
  Filled 2016-04-25: qty 100

## 2016-04-25 MED ORDER — IOPAMIDOL (ISOVUE-300) INJECTION 61%
100.0000 mL | Freq: Once | INTRAVENOUS | Status: AC | PRN
  Administered 2016-04-25: 50 mL via ORAL

## 2016-04-25 MED ORDER — OXYCODONE HCL 5 MG PO TABS: 5.0000 mg | ORAL_TABLET | ORAL | 0 refills | Status: AC | PRN

## 2016-04-25 NOTE — Discharge Instructions (Signed)
[facility]  ACHALASIA SURGERY AFTERCARE INSTRUCTIONS  Start on a Level 1 diet (see below) and do not advance past it until told to by your doctor. No lifting, pushing or pulling over 10 pounds for 2 month. Do not overeat! Walk frequently. You may drive when you are pain-free. Minimize bending and squatting. Take a liquid laxative of your choice if you have not had bowel movement within 3-4 days of your surgery. Call for high fever (>101.5), vomiting, inability to swallow, wound problems.   EATING AFTER YOUR HIATAL HERNIA REPAIR SURGERY  After your esophageal surgery, you can expect some difficulty swallowing.  If food sticks when you eat, it is called "dysphagia".  This is due to swelling around your surgery site and will most likely resolve within a few weeks.  To help you through this temporary phase, we start you out on a pureed diet.  Your first meal in the hospital was clear liquids.  You should have been given a pureed diet by the time you left the hospital.  We ask patients to stay on a pureed diet for the first two weeks to avoid anything getting "stuck" near your recent surgery.  Don't be alarmed if your ability to swallow doesn't progress according to this plan.  Everyone is different and some take longer or shorter.  Use common sense.  If you are having trouble swallowing a particular food, then avoid it.  If food is sticking when you advance your diet, go back to the previous day or two.  In general some simple rules to follow are:  Maintain an upright position (as near 90 degrees as possible) whenever eating or drinking.  Take small bites - only 1/2 to 1 teaspoon at a time.  Eat slowly.  It may also help to eat only one food at a time.  Avoid talking while eating.  Do not mix solid foods and liquids in the same mouthful and do not "wash foods down" with liquids, unless you have been instructed to do so by your surgeon.  Eat in a relaxed atmosphere,  with no distractions.  Following each meal, sit in an upright position (90 degree angle) for 60-90 minutes.  Avoid carbonated (bubbly) drinks. Do not use straws.  If food does stick, don't panic.  Try to relax and let the food pass on its own.  Sipping strong hot black tea can also help.  If you have any questions please call our office at [phone].   LEVEL 1 PUREED FOODS:  1ST 2 WEEKS AFTER SURGERY Foods in this group are pureed or blenderized to a smooth, mashed potato-like consistency.  If necessary, the pureed foods can keep their shape with the addition of a thickening agent.  Meat should be pureed to a smooth pasty consistency.  Hot broth or gravy may be added to the pureed meat, approximately 1 oz. of liquid per 3 oz. serving of meat. CAUTION:  If any foods do not puree into a smooth consistency, it may make eating for swallowing more difficult.  For example, zucchini seeds sometimes do not blend well. Hot Foods Cold Foods  Pureed scrambled eggs and cheese Pureed cottage cheese  Baby cereals Thickened juices and nectars  Thinned cooked cereals (no lumps) Thickened milk or eggnog  Pureed [place] toast or pancakes Ensure  Mashed potatoes Ice cream  Pureed parsley, au gratin, scalloped potatoes, candied sweet potatoes Fruit or [place] ice, sherbet  Pureed buttered or alfredo noodles Plain yogurt  Pureed  vegetables (no corn or peas) Instant breakfast  Pureed soups and creamed soups Smooth pudding, mousse, custard  Pureed scalloped apples Whipped gelatin  Gravies Sugar, syrup, honey, jelly  Sauces, cheese, tomato, barbecue, white, creamed Cream  Any baby food Creamer  Alcohol in moderation (not beer or champagne) Margarine  Coffee or tea Mayonnaise   Ketchup, mustard   Apple sauce   SAMPLE MENU:  PUREED DIET Breakfast Lunch Dinner   Orange juice, 1/2 cup  Cream of wheat, 1/2 cup  Pineapple juice, 1/2 cup  Pureed [place]key, barley soup, 3/4 cup  Pureed Hawaiian chicken, 3  oz   Scrambled eggs, mashed or blended with cheese, 1/2 cup  Tea or coffee, 1 cup   Whole milk, 1 cup   Non-dairy creamer, 2 Tbsp.  Mashed potatoes, 1/2 cup  Pureed cooled broccoli, 1/2 cup  Apple sauce, 1/2 cup  Coffee or tea  Mashed potatoes, 1/2 cup  Pureed spinach, 1/2 cup  Frozen yogurt, 1/2 cup  Tea or coffee    LEVEL 2 After your first 2 weeks, you can advance to a soft diet.  Keep on this diet until everything goes down easily. Hot Foods Cold Foods  White fish Cottage cheese  Stuffed fish Junior baby fruit  Baby food meals Semi thickened juices  Minced soft cooked, scrambled, poached eggs nectars  Souffle & omelets Ripe mashed bananas  Cooked cereals Canned fruit, pineapple sauce, milk  potatoes Milkshake  Buttered or Alfredo noodles Custard  Cooked cooled vegetable Puddings, including tapioca  Sherbet Yogurt  Vegetable soup or alphabet soup Fruit ice, [place] ice  Gravies Whipped gelatin  Sugar, syrup, honey, jelly Junior baby desserts  Sauces:  Cheese, creamed, barbecue, tomato, white Cream  Coffee or tea Margarine   SAMPLE MENU:  LEVEL 2 Breakfast Lunch Dinner   Orange juice, 1/2 cup  Oatmeal, 1/2 cup  Scrambled eggs with cheese, 1/2 cup  Decaffeinated tea, 1 cup  Whole milk, 1 cup  Non-dairy creamer, 2 Tbsp  Pineapple juice, 1/2 cup  Minced beef, 3 oz  Gravy, 2 Tbsp  Mashed potatoes, 1/2 cup  Minced fresh broccoli, 1/2 cup  Applesauce, 1/2 cup  Coffee, 1 cup  [place]key, barley soup, 3/4 cup  Minced Hawaiian chicken, 3 oz  Mashed potatoes, 1/2 cup  Cooked spinach, 1/2 cup  Frozen yogurt, 1/2 cup  Non-dairy creamer, 2 Tbsp    LEVEL 3 After all the foods in level 2 (soft diet) are passing through well you should advance up to the next level.  It is still important to cut these foods into small pieces and eat slowly. Hot Foods Cold Foods  Poultry Cottage cheese  Chopped Swedish meatballs Yogurt  Meat salads (ground or  flaked meat) Milk  Flaked fish (tuna) Milkshakes  Poached or scrambled eggs Soft, cold, dry cereal  Souffles and omelets Fruit juices or nectars  Cooked cereals Chopped canned fruit  Chopped [place] toast or pancakes Canned fruit cocktail  Noodles or pasta (no rice) Pudding, mousse, custard  Cooked vegetables (no frozen peas, corn, or mixed vegetables) Green salad  Canned small sweet peas Ice cream  Creamed soup or vegetable soup Fruit ice, [place] ice  Pureed vegetable soup or alphabet soup Non-dairy creamer  Ground scalloped apples Margarine  Gravies Mayonnaise  Sauces:  Cheese, creamed, barbecue, tomato, white Ketchup  Coffee or tea Mustard   SAMPLE MENU:  LEVEL 3 Breakfast Lunch Dinner   Orange juice, 1/2 cup  Oatmeal, 1/2 cup  Scrambled eggs with  cheese, 1/2 cup  Decaffeinated tea, 1 cup  Whole milk, 1 cup  Non-dairy creamer, 2 Tbsp  Ketchup, 1 Tbsp  Margarine, 1 tsp  Salt, 1/4 tsp  Sugar, 2 tsp  Pineapple juice, 1/2 cup  Ground beef, 3 oz  Gravy, 2 Tbsp  Mashed potatoes, 1/2 cup  Cooked spinach, 1/2 cup  Applesauce, 1/2 cup  Decaffeinated coffee  Whole milk  Non-dairy creamer, 2 Tbsp  Margarine, 1 tsp  Salt, 1/4 tsp  Pureed [place], barley soup, 3/4 cup  Barbecue chicken, 3 oz  Mashed potatoes, 1/2 cup  Ground fresh broccoli, 1/2 cup  Frozen yogurt, 1/2 cup  Decaffeinated tea, 1 cup  Non-dairy creamer, 2 Tbsp  Margarine, 1 tsp  Salt, 1/4 tsp  Sugar, 1 tsp    LEVEL 4:  REGULAR FOODS Foods in this group are soft, moist, regularly textured foods.  This level includes red meat and breads, which tend to be the hardest things to swallow.  Eat very slow, chew well and continue to avoid carbonated drinks. Hot Foods Cold Foods  Baked fish or skinned Soft cheeses - cottage cheese  Souffles and omelets Cream cheese  Eggs Yogurt  Stuffed shells Milk  Spaghetti with meat sauce Milkshakes  Cooked cereal Cold dry cereals (no nuts, dried  fruit, coconut)  [place] toast or pancakes Crackers  Buttered toast Fruit juices or nectars  Noodles or pasta (no rice) Canned fruit  Potatoes (all types) Ripe bananas  Soft, cooked vegetables (no corn, lima, or baked beans) Peeled, ripe, fresh fruit  Creamed soups or vegetable soup Cakes (no nuts, dried fruit, coconut)  Canned chicken noodle soup Plain doughnuts  Gravies Ice cream  Bacon dressing Pudding, mousse, custard  Sauces:  Cheese, creamed, barbecue, tomato, white Fruit ice, [place] ice, sherbet  Decaffeinated tea or coffee Whipped gelatin  Pork chops Regular gelatin   Canned fruited gelatin molds   Sugar, syrup, honey, jam, jelly   Cream   Non-dairy   Margarine   Oil   Mayonnaise   Ketchup   Mustard

## 2016-04-25 NOTE — Progress Notes (Signed)
Assessment Principal Problem:   Achalasia s/p laparoscopic heller myotomy and Dor Fundoplication 04/24/16-some incisional soreness, otherwise doing okay.   Plan:  UGI and if no leak, will start liquid diet.  Add Robaxin IV.   LOS: 1 day     1 Day Post-Op  Subjective: She c/o a sore throat and incisional soreness.  Has walked to bathroom.  Objective: Vital signs in last 24 hours: Temp:  [97.7 F (36.5 C)-99.6 F (37.6 C)] 99.6 F (37.6 C) (12/01 0600) Pulse Rate:  [63-93] 66 (12/01 0600) Resp:  [13-22] 18 (12/01 0600) BP: (88-107)/(45-69) 104/55 (12/01 0600) SpO2:  [99 %-100 %] 100 % (12/01 0600) Last BM Date: 04/21/16  Intake/Output from previous day: 11/30 0701 - 12/01 0700 In: 3577.5 [I.V.:3527.5; IV Piggyback:50] Out: 740 [Urine:690; Blood:50] Intake/Output this shift: No intake/output data recorded.  PE: General- In NAD. Slightly hoarse Lungs-clear Abdomen-soft, dressings dry, tender at incision sites  Lab Results:   Recent Labs  04/24/16 1523  WBC 15.5*  HGB 11.8*  HCT 36.7  PLT 199   BMET  Recent Labs  04/24/16 1523  CREATININE 0.85   PT/INR No results for input(s): LABPROT, INR in the last 72 hours. Comprehensive Metabolic Panel:    Component Value Date/Time   NA 142 04/21/2016 1323   NA 142 02/13/2016 1701   K 5.1 04/21/2016 1323   K 3.6 02/13/2016 1701   CL 109 04/21/2016 1323   CL 105 02/13/2016 1701   CO2 27 04/21/2016 1323   CO2 29 02/13/2016 1701   BUN 10 04/21/2016 1323   BUN 8 02/13/2016 1701   CREATININE 0.85 04/24/2016 1523   CREATININE 0.99 04/21/2016 1323   GLUCOSE 64 (L) 04/21/2016 1323   GLUCOSE 82 02/13/2016 1701   CALCIUM 10.0 04/21/2016 1323   CALCIUM 9.8 02/13/2016 1701     Studies/Results: No results found.  Anti-infectives: Anti-infectives    Start     Dose/Rate Route Frequency Ordered Stop   04/24/16 1500  ceFAZolin (ANCEF) IVPB 1 g/50 mL premix     1 g 100 mL/hr over 30 Minutes Intravenous Every 8  hours 04/24/16 1411 04/24/16 1450   04/24/16 0550  ceFAZolin (ANCEF) IVPB 2g/100 mL premix     2 g 200 mL/hr over 30 Minutes Intravenous On call to O.R. 04/24/16 0550 04/24/16 0740       [name] J 04/25/2016

## 2016-04-25 NOTE — Progress Notes (Signed)
UGI-no leak; contrast passes into stomach when she was semi-erect.  Will start full liquids.

## 2016-04-26 MED ORDER — OXYCODONE HCL 5 MG PO TABS: 5.0000 mg | ORAL_TABLET | ORAL | 0 refills | Status: AC | PRN

## 2016-04-26 MED ORDER — ONDANSETRON 4 MG PO TBDP: 4.0000 mg | ORAL_TABLET | Freq: Four times a day (QID) | ORAL | 0 refills | Status: AC | PRN

## 2016-04-26 NOTE — Progress Notes (Signed)
Pt's vitals are wnl, tolerating diet and pain is under controlled. Discussed discharge instructions with patient. Discharged to home with prescriptions.

## 2016-04-26 NOTE — Discharge Summary (Signed)
  Physician Discharge Summary  Patient ID: [name] MRN: [id number] DOB/AGE: 20/08/1995 20 y.o.  Admit date: 04/24/2016 Discharge date: 04/26/2016  Admission Diagnoses:  achalasia  Discharge Diagnoses:  same  Principal Problem:   Achalasia s/p laparoscopic heller myotomy and Dor Fundoplication 04/24/16   Surgery:  Achalasia s/p laparoscopic heller myotomy and Dor Fundoplication 04/24/16  Discharged Condition: improved  Hospital Course:   Had surgery.  UGI on PD 1 good.  Started on liquids which are tolerated.  Ready for discharge.    Consults: none  Significant Diagnostic Studies: UGI    Discharge Exam: Blood pressure (!) 96/57, pulse 78, temperature 98.9 F (37.2 C), temperature source Oral, resp. rate 16, height 5\' 3"  (1.6 m), weight 45.8 kg (101 lb), last menstrual period 03/20/2016, SpO2 100 %. Incisions ok  Disposition: 01-Home or Self Care  Discharge Instructions    Call MD for:  persistant nausea and vomiting    Complete by:  As directed    Call MD for:  severe uncontrolled pain    Complete by:  As directed    Discharge instructions    Complete by:  As directed    Follow Dr. Maris Bergerosenbower's orders on liquid diet until you see him back in the office   Increase activity slowly    Complete by:  As directed        Medication List    TAKE these medications   ondansetron 4 MG disintegrating tablet Commonly known as:  ZOFRAN-ODT Take 1 tablet (4 mg total) by mouth every 6 (six) hours as needed for nausea.   ondansetron 4 MG tablet Commonly known as:  ZOFRAN Take 1 tablet (4 mg total) by mouth every 4 (four) hours as needed for nausea. What changed:  when to take this  reasons to take this   oxyCODONE 5 MG immediate release tablet Commonly known as:  Oxy IR/ROXICODONE Take 1 tablet (5 mg total) by mouth every 4 (four) hours as needed for moderate pain, severe pain or breakthrough pain.   oxyCODONE 5 MG immediate release tablet Commonly known  as:  Oxy IR/ROXICODONE Take 1 tablet (5 mg total) by mouth every 4 (four) hours as needed for moderate pain.        SignedValarie Merino: Terita Hejl B 04/26/2016, 12:30 PM

## 2017-03-27 ENCOUNTER — Encounter: Payer: BLUE CROSS/BLUE SHIELD | Admitting: Certified Nurse Midwife

## 2018-07-20 IMAGING — RF DG UGI W/ GASTROGRAFIN
8 of 10 series · 13 of 18 positions shown · IV contrast (iopamidol)
Comparison: None.

FLUOROSCOPY TIME:  Fluoroscopy Time:  2 minutes 5 seconds

CLINICAL DATA: Achalasia. Postop Laerti myotomy and Trisha
fundoplication on 04/24/2016.

EXAM:
WATER SOLUBLE UPPER GI SERIES
TECHNIQUE: Single-column upper GI series was performed using water soluble
contrast.
CONTRAST:  50mL L8EMR1-6EE IOPAMIDOL (L8EMR1-6EE) INJECTION 61%

[Series 1: t abdomen supine · 0.15mm/px · 1 of 1 slices shown]
[im 1/1]
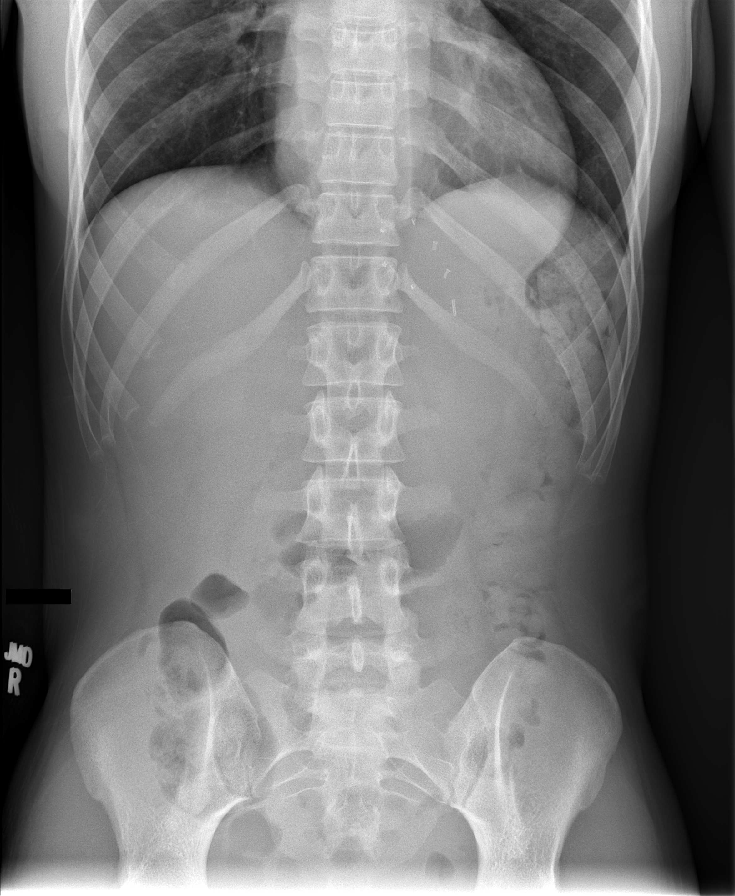

[Series 2: cp_standard · 0.41mm/px · 3 of 24 frames shown (1 of 3)]
[frame 11/24]
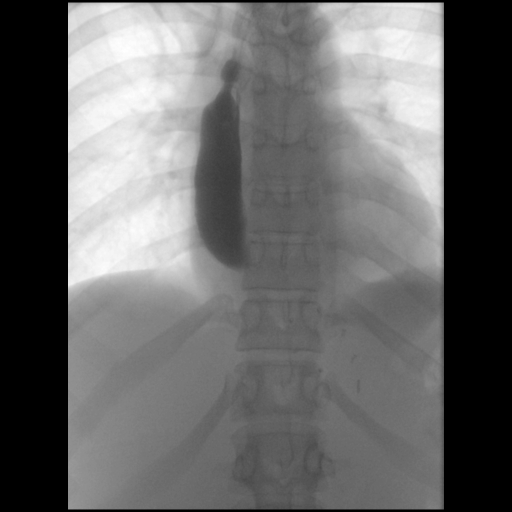
[frame 13/24]
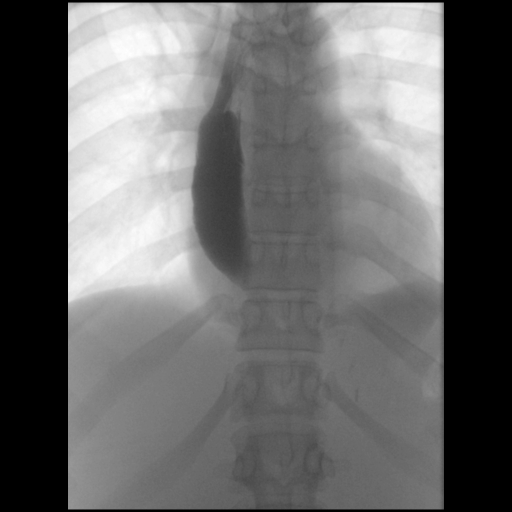
[frame 21/24]
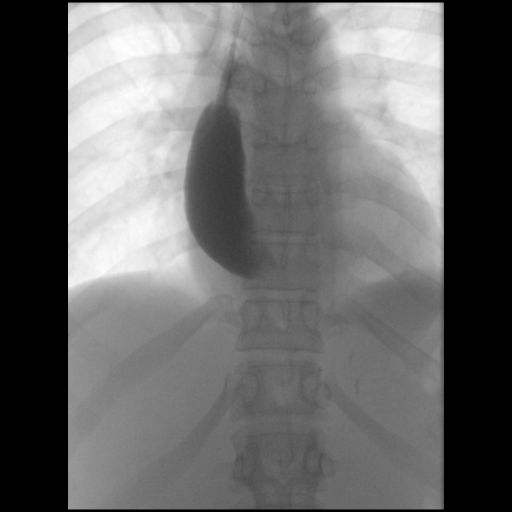

[Series 3: cp_standard · 0.41mm/px · 2 of 29 frames shown (2 of 3)]
[frame 15/29]
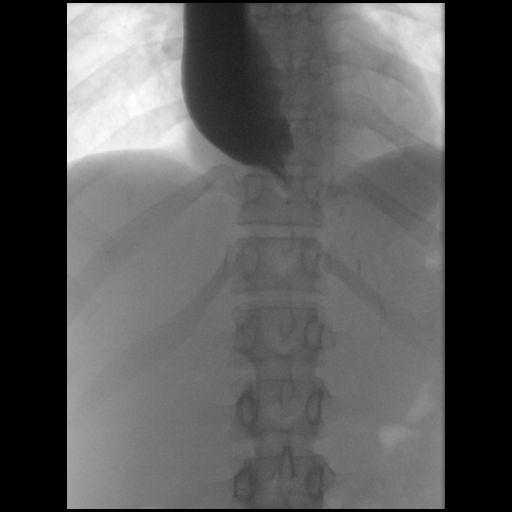
[frame 25/29]
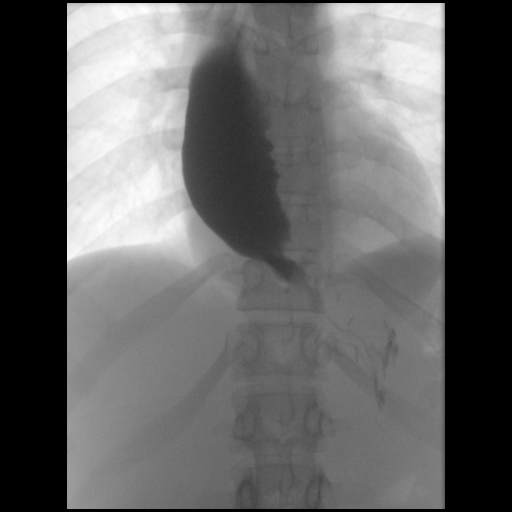

[Series 4: cp_standard · 0.41mm/px · 3 of 23 frames shown (3 of 3)]
[frame 12/23]
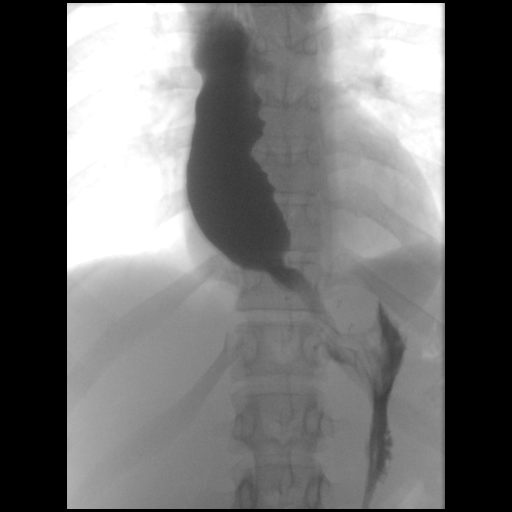
[frame 20/23]
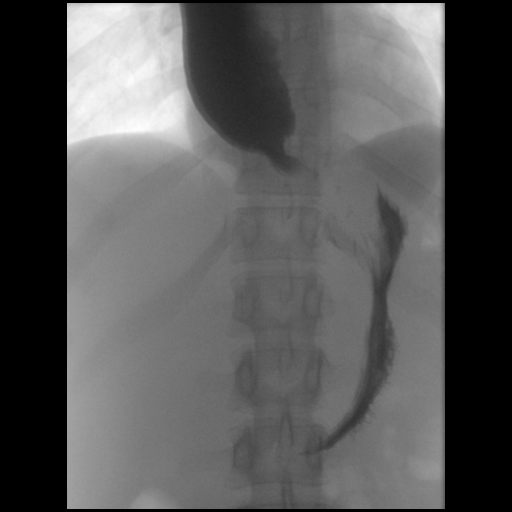
[frame 22/23]
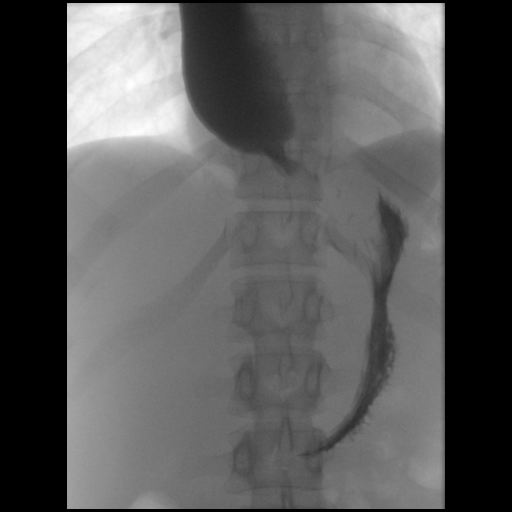

[Series 6: fluoro_barium 2fps_bw · 0.21mm/px · 1 of 1 slices shown (1 of 4)]
[im 1/1]
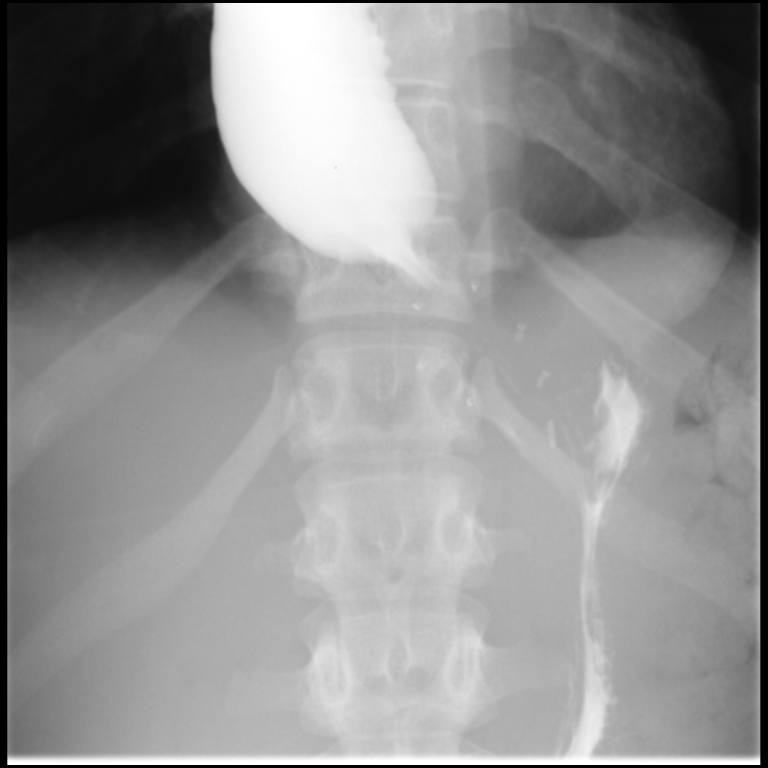

[Series 7: fluoro_barium 2fps_bw · 0.20mm/px · 1 of 1 slices shown (2 of 4)]
[im 1/1]
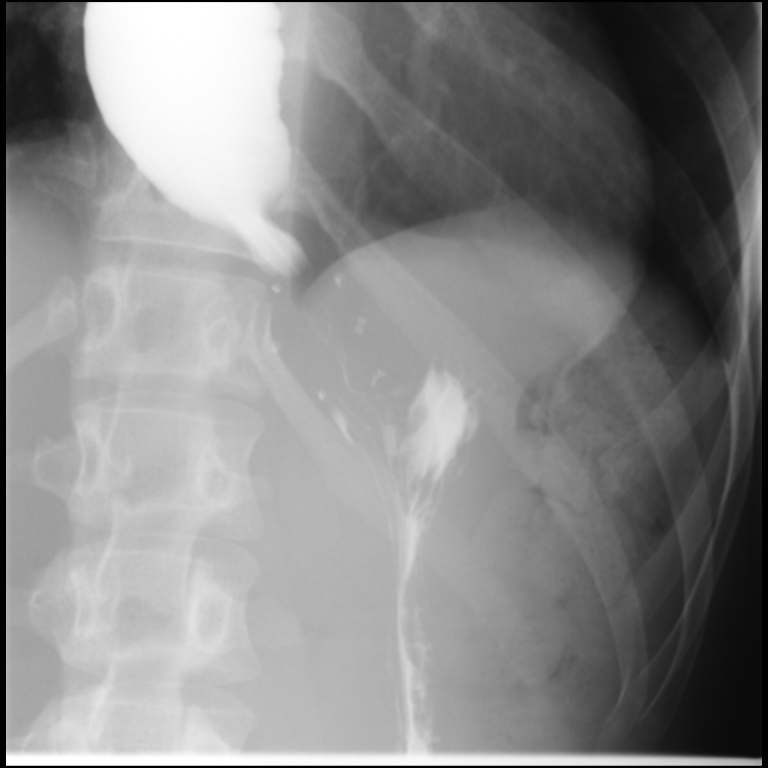

[Series 8: fluoro_barium 2fps_bw · 0.21mm/px · 1 of 1 slices shown (3 of 4)]
[im 1/1]
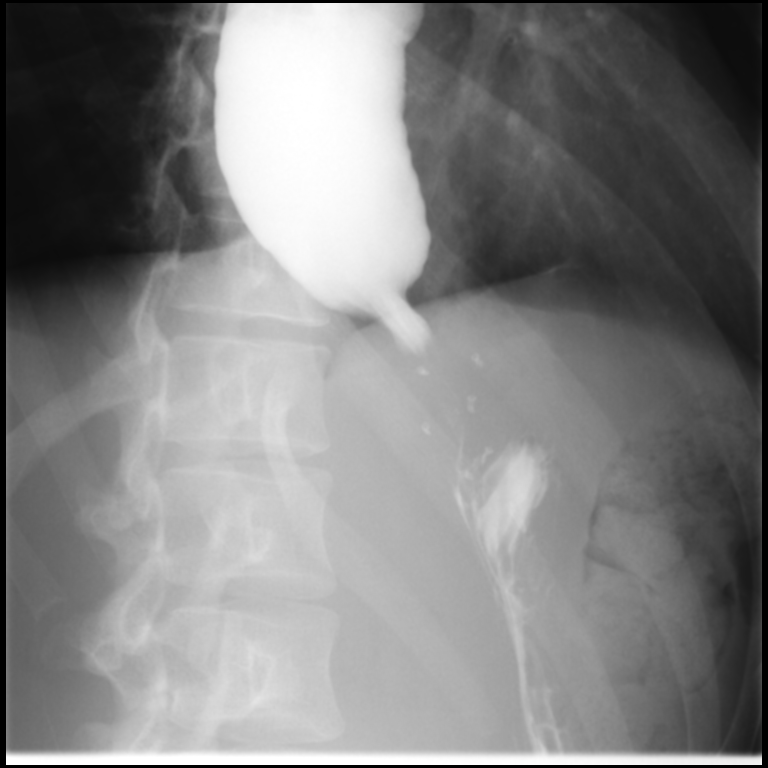

[Series 10: fluoro_barium 2fps_bw · 0.21mm/px · 1 of 1 slices shown (4 of 4)]
[im 1/1]
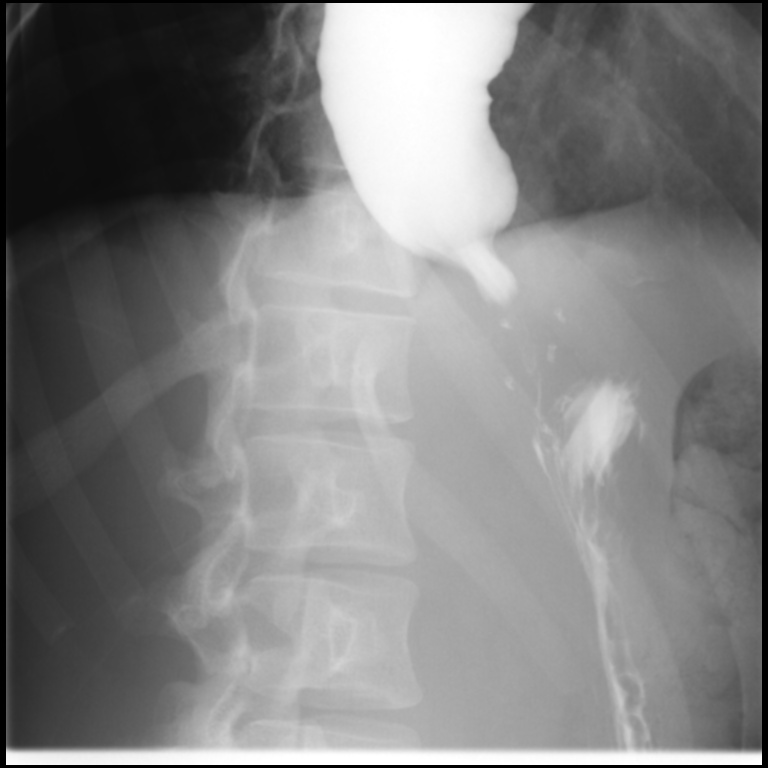

[13 of 18 positions shown; findings below may reference images not displayed]

FINDINGS: There is diffuse dilatation of esophagus with poor peristalsis.
Contrast only passed through the site of the myotomy and
fundoplication in the semi-erect position. There is no evidence of
extravasation of contrast. The lesser curvature of the stomach is
indented by the prominent left lobe of the liver. Does the patient
have a history of hepatomegaly?
IMPRESSION: No evidence of contrast extravasation at the site of myotomy and
fundoplication. Mass effect upon the lesser curvature of the stomach
is most likely secondary to enlargement of the left lobe of the
liver of unknown etiology.
